# Patient Record
Sex: Male | Born: 1945 | Race: Black or African American | Hispanic: No | Marital: Single | Smoking: Current some day smoker
Health system: Southern US, Community
[De-identification: ages and names within clinical notes are randomized; demographics above are authoritative.]

---

## 2008-11-02 ENCOUNTER — Emergency Department (HOSPITAL_COMMUNITY): Admission: EM | Admit: 2008-11-02 | Discharge: 2008-11-02 | Payer: Self-pay | Admitting: Emergency Medicine
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
## 2011-02-16 LAB — POCT I-STAT, CHEM 8
Calcium, Ion: 1.08 mmol/L — ABNORMAL LOW (ref 1.12–1.32)
Glucose, Bld: 109 mg/dL — ABNORMAL HIGH (ref 70–99)
HCT: 43 % (ref 39.0–52.0)
Hemoglobin: 14.6 g/dL (ref 13.0–17.0)
TCO2: 25 mmol/L (ref 0–100)

## 2011-02-16 LAB — DIFFERENTIAL
Basophils Relative: 1 % (ref 0–1)
Eosinophils Absolute: 0.1 10*3/uL (ref 0.0–0.7)
Eosinophils Relative: 3 % (ref 0–5)
Monocytes Absolute: 0.4 10*3/uL (ref 0.1–1.0)
Monocytes Relative: 8 % (ref 3–12)

## 2011-02-16 LAB — CBC
Hemoglobin: 12.9 g/dL — ABNORMAL LOW (ref 13.0–17.0)
MCHC: 31.6 g/dL (ref 30.0–36.0)
MCV: 86.3 fL (ref 78.0–100.0)
RBC: 4.74 MIL/uL (ref 4.22–5.81)
RDW: 14.5 % (ref 11.5–15.5)
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
## 2018-06-24 ENCOUNTER — Emergency Department (HOSPITAL_COMMUNITY): Payer: No Typology Code available for payment source | Admitting: Certified Registered Nurse Anesthetist

## 2018-06-24 ENCOUNTER — Ambulatory Visit (HOSPITAL_COMMUNITY)
Admission: EM | Admit: 2018-06-24 | Discharge: 2018-06-24 | Disposition: A | Discharge status: Home / Self Care | Payer: No Typology Code available for payment source | Attending: Emergency Medicine | Admitting: Emergency Medicine

## 2018-06-24 ENCOUNTER — Emergency Department (HOSPITAL_COMMUNITY): Payer: No Typology Code available for payment source

## 2018-06-24 ENCOUNTER — Other Ambulatory Visit: Payer: Self-pay

## 2018-06-24 ENCOUNTER — Ambulatory Visit (HOSPITAL_COMMUNITY)
Admission: EM | Admit: 2018-06-24 | Discharge: 2018-06-24 | Disposition: A | Discharge status: Home / Self Care | Payer: Self-pay

## 2018-06-24 ENCOUNTER — Encounter (HOSPITAL_COMMUNITY): Payer: Self-pay

## 2018-06-24 ENCOUNTER — Encounter (HOSPITAL_COMMUNITY)
Admission: EM | Disposition: A | Discharge status: Home / Self Care | Payer: Self-pay | Source: Home / Self Care | Attending: Emergency Medicine

## 2018-06-24 DIAGNOSIS — S68119A Complete traumatic metacarpophalangeal amputation of unspecified finger, initial encounter: Secondary | ICD-10-CM

## 2018-06-24 DIAGNOSIS — S68120A Partial traumatic metacarpophalangeal amputation of right index finger, initial encounter: Secondary | ICD-10-CM | POA: Insufficient documentation

## 2018-06-24 DIAGNOSIS — W3189XA Contact with other specified machinery, initial encounter: Secondary | ICD-10-CM | POA: Insufficient documentation

## 2018-06-24 DIAGNOSIS — F17210 Nicotine dependence, cigarettes, uncomplicated: Secondary | ICD-10-CM | POA: Diagnosis not present

## 2018-06-24 HISTORY — PX: AMPUTATION: SHX166

## 2018-06-24 LAB — BASIC METABOLIC PANEL
Anion gap: 11 (ref 5–15)
BUN: 19 mg/dL (ref 8–23)
CHLORIDE: 105 mmol/L (ref 98–111)
CO2: 24 mmol/L (ref 22–32)
CREATININE: 1.39 mg/dL — AB (ref 0.61–1.24)
Calcium: 9.4 mg/dL (ref 8.9–10.3)
GFR calc Af Amer: 57 mL/min — ABNORMAL LOW (ref >60)
GFR calc non Af Amer: 49 mL/min — ABNORMAL LOW (ref >60)
Glucose, Bld: 99 mg/dL (ref 70–99)
POTASSIUM: 3.8 mmol/L (ref 3.5–5.1)
SODIUM: 140 mmol/L (ref 135–145)

## 2018-06-24 LAB — CBC WITH DIFFERENTIAL/PLATELET
ABS IMMATURE GRANULOCYTES: 0 10*3/uL (ref 0.0–0.1)
BASOS ABS: 0 10*3/uL (ref 0.0–0.1)
Basophils Relative: 0 %
EOS PCT: 1 %
Eosinophils Absolute: 0.1 10*3/uL (ref 0.0–0.7)
HCT: 42 % (ref 39.0–52.0)
HEMOGLOBIN: 13.2 g/dL (ref 13.0–17.0)
Immature Granulocytes: 0 %
LYMPHS ABS: 1.7 10*3/uL (ref 0.7–4.0)
LYMPHS PCT: 35 %
MCH: 26.9 pg (ref 26.0–34.0)
MCHC: 31.4 g/dL (ref 30.0–36.0)
MCV: 85.5 fL (ref 78.0–100.0)
MONO ABS: 0.3 10*3/uL (ref 0.1–1.0)
MONOS PCT: 7 %
NEUTROS ABS: 2.7 10*3/uL (ref 1.7–7.7)
Neutrophils Relative %: 57 %
Platelets: 265 10*3/uL (ref 150–400)
RBC: 4.91 MIL/uL (ref 4.22–5.81)
RDW: 14.6 % (ref 11.5–15.5)
WBC: 4.8 10*3/uL (ref 4.0–10.5)

## 2018-06-24 SURGERY — AMPUTATION DIGIT
Anesthesia: Monitor Anesthesia Care | Site: Finger | Laterality: Right

## 2018-06-24 MED ORDER — PROPOFOL 500 MG/50ML IV EMUL
INTRAVENOUS | Status: DC | PRN
  Administered 2018-06-24: 25 ug/kg/min via INTRAVENOUS

## 2018-06-24 MED ORDER — EPHEDRINE 5 MG/ML INJ
INTRAVENOUS | Status: AC
  Filled 2018-06-24: qty 10

## 2018-06-24 MED ORDER — OXYCODONE HCL 5 MG PO TABS: 5.0000 mg | ORAL_TABLET | Freq: Four times a day (QID) | ORAL | 0 refills | Status: AC | PRN

## 2018-06-24 MED ORDER — PROPOFOL 10 MG/ML IV BOLUS
INTRAVENOUS | Status: AC
  Filled 2018-06-24: qty 20

## 2018-06-24 MED ORDER — LIDOCAINE HCL (PF) 1 % IJ SOLN: 5.0000 mL | Freq: Once | INTRAMUSCULAR | Status: DC

## 2018-06-24 MED ORDER — TETANUS-DIPHTH-ACELL PERTUSSIS 5-2.5-18.5 LF-MCG/0.5 IM SUSP
0.5000 mL | Freq: Once | INTRAMUSCULAR | Status: AC
  Administered 2018-06-24: 0.5 mL via INTRAMUSCULAR
  Filled 2018-06-24: qty 0.5

## 2018-06-24 MED ORDER — BUPIVACAINE HCL (PF) 0.25 % IJ SOLN
INTRAMUSCULAR | Status: AC
  Filled 2018-06-24: qty 30

## 2018-06-24 MED ORDER — HYDROMORPHONE HCL 1 MG/ML IJ SOLN
1.0000 mg | Freq: Once | INTRAMUSCULAR | Status: AC
  Administered 2018-06-24: 1 mg via INTRAVENOUS
  Filled 2018-06-24: qty 1

## 2018-06-24 MED ORDER — LIDOCAINE HCL (PF) 1 % IJ SOLN
INTRAMUSCULAR | Status: AC
  Filled 2018-06-24: qty 30

## 2018-06-24 MED ORDER — EPHEDRINE SULFATE-NACL 50-0.9 MG/10ML-% IV SOSY
PREFILLED_SYRINGE | INTRAVENOUS | Status: DC | PRN
  Administered 2018-06-24 (×4): 5 mg via INTRAVENOUS

## 2018-06-24 MED ORDER — FENTANYL CITRATE (PF) 100 MCG/2ML IJ SOLN
INTRAMUSCULAR | Status: DC | PRN
  Administered 2018-06-24: 50 ug via INTRAVENOUS

## 2018-06-24 MED ORDER — 0.9 % SODIUM CHLORIDE (POUR BTL) OPTIME
TOPICAL | Status: DC | PRN
  Administered 2018-06-24: 1000 mL

## 2018-06-24 MED ORDER — CEFAZOLIN SODIUM-DEXTROSE 2-4 GM/100ML-% IV SOLN: 2.0000 g | INTRAVENOUS | Status: DC

## 2018-06-24 MED ORDER — CEFAZOLIN SODIUM-DEXTROSE 2-4 GM/100ML-% IV SOLN
2.0000 g | Freq: Once | INTRAVENOUS | Status: AC
  Administered 2018-06-24: 2 g via INTRAVENOUS
  Filled 2018-06-24: qty 100

## 2018-06-24 MED ORDER — MIDAZOLAM HCL 5 MG/5ML IJ SOLN
INTRAMUSCULAR | Status: DC | PRN
  Administered 2018-06-24: 1 mg via INTRAVENOUS

## 2018-06-24 MED ORDER — CEPHALEXIN 500 MG PO CAPS: 500.0000 mg | ORAL_CAPSULE | Freq: Four times a day (QID) | ORAL | 0 refills | Status: AC

## 2018-06-24 MED ORDER — PHENYLEPHRINE 40 MCG/ML (10ML) SYRINGE FOR IV PUSH (FOR BLOOD PRESSURE SUPPORT)
PREFILLED_SYRINGE | INTRAVENOUS | Status: DC | PRN
  Administered 2018-06-24 (×3): 40 ug via INTRAVENOUS

## 2018-06-24 MED ORDER — LIDOCAINE HCL (PF) 1 % IJ SOLN
INTRAMUSCULAR | Status: DC | PRN
  Administered 2018-06-24: 5 mL

## 2018-06-24 MED ORDER — LACTATED RINGERS IV SOLN
INTRAVENOUS | Status: DC
  Administered 2018-06-24: 15:00:00 via INTRAVENOUS

## 2018-06-24 MED ORDER — FENTANYL CITRATE (PF) 250 MCG/5ML IJ SOLN
INTRAMUSCULAR | Status: AC
  Filled 2018-06-24: qty 5

## 2018-06-24 MED ORDER — POVIDONE-IODINE 10 % EX SWAB: 2.0000 "application " | Freq: Once | CUTANEOUS | Status: DC

## 2018-06-24 MED ORDER — MIDAZOLAM HCL 2 MG/2ML IJ SOLN
INTRAMUSCULAR | Status: AC
  Filled 2018-06-24: qty 2

## 2018-06-24 MED ORDER — IBUPROFEN 200 MG PO TABS: 600.0000 mg | ORAL_TABLET | Freq: Four times a day (QID) | ORAL | Status: AC

## 2018-06-24 MED ORDER — CHLORHEXIDINE GLUCONATE 4 % EX LIQD
60.0000 mL | Freq: Once | CUTANEOUS | Status: DC
  Filled 2018-06-24: qty 60

## 2018-06-24 MED ORDER — BUPIVACAINE HCL (PF) 0.25 % IJ SOLN
INTRAMUSCULAR | Status: DC | PRN
  Administered 2018-06-24: 5 mL

## 2018-06-24 MED ORDER — ACETAMINOPHEN 325 MG PO TABS: 650.0000 mg | ORAL_TABLET | Freq: Four times a day (QID) | ORAL | Status: AC

## 2018-06-24 MED ORDER — PROPOFOL 10 MG/ML IV BOLUS
INTRAVENOUS | Status: DC | PRN
  Administered 2018-06-24: 20 mg via INTRAVENOUS

## 2018-06-24 SURGICAL SUPPLY — 47 items
BANDAGE COBAN STERILE 2 (GAUZE/BANDAGES/DRESSINGS) ×4 IMPLANT
BLADE AVERAGE 25MMX9MM (BLADE)
BLADE AVERAGE 25X9 (BLADE) IMPLANT
BLADE SURG 15 STRL LF DISP TIS (BLADE) ×2 IMPLANT
BLADE SURG 15 STRL SS (BLADE) ×2
BNDG COHESIVE 1X5 TAN STRL LF (GAUZE/BANDAGES/DRESSINGS) ×4 IMPLANT
BNDG COHESIVE 4X5 TAN NS LF (GAUZE/BANDAGES/DRESSINGS) IMPLANT
BNDG COHESIVE 4X5 TAN STRL (GAUZE/BANDAGES/DRESSINGS) ×4 IMPLANT
BNDG CONFORM 2 STRL LF (GAUZE/BANDAGES/DRESSINGS) ×4 IMPLANT
BNDG CONFORM 3 STRL LF (GAUZE/BANDAGES/DRESSINGS) ×4 IMPLANT
BNDG ELASTIC 2X5.8 VLCR STR LF (GAUZE/BANDAGES/DRESSINGS) IMPLANT
BNDG ESMARK 4X9 LF (GAUZE/BANDAGES/DRESSINGS) ×4 IMPLANT
BNDG GAUZE ELAST 4 BULKY (GAUZE/BANDAGES/DRESSINGS) ×8 IMPLANT
BRUSH SCRUB EZ PLAIN DRY (MISCELLANEOUS) IMPLANT
CANISTER SUCT 3000ML PPV (MISCELLANEOUS) ×4 IMPLANT
CHLORAPREP W/TINT 26ML (MISCELLANEOUS) ×4 IMPLANT
CORDS BIPOLAR (ELECTRODE) ×4 IMPLANT
CUFF TOURNIQUET SINGLE 24IN (TOURNIQUET CUFF) ×4 IMPLANT
DRAPE SURG 17X23 STRL (DRAPES) ×4 IMPLANT
DRSG ADAPTIC 3X8 NADH LF (GAUZE/BANDAGES/DRESSINGS) ×4 IMPLANT
DRSG EMULSION OIL 3X3 NADH (GAUZE/BANDAGES/DRESSINGS) ×4 IMPLANT
ELECT REM PT RETURN 9FT ADLT (ELECTROSURGICAL)
ELECTRODE REM PT RTRN 9FT ADLT (ELECTROSURGICAL) IMPLANT
GAUZE SPONGE 4X4 12PLY STRL (GAUZE/BANDAGES/DRESSINGS) ×4 IMPLANT
GAUZE XEROFORM 1X8 LF (GAUZE/BANDAGES/DRESSINGS) ×4 IMPLANT
GLOVE BIO SURGEON STRL SZ7.5 (GLOVE) ×4 IMPLANT
GLOVE BIOGEL PI IND STRL 8 (GLOVE) ×2 IMPLANT
GLOVE BIOGEL PI INDICATOR 8 (GLOVE) ×2
GOWN STRL REUS W/ TWL XL LVL3 (GOWN DISPOSABLE) ×2 IMPLANT
GOWN STRL REUS W/TWL XL LVL3 (GOWN DISPOSABLE) ×2
KIT BASIN OR (CUSTOM PROCEDURE TRAY) ×4 IMPLANT
NEEDLE HYPO 25X1 1.5 SAFETY (NEEDLE) IMPLANT
NS IRRIG 1000ML POUR BTL (IV SOLUTION) ×4 IMPLANT
PACK ORTHO EXTREMITY (CUSTOM PROCEDURE TRAY) ×4 IMPLANT
PAD CAST 4YDX4 CTTN HI CHSV (CAST SUPPLIES) ×2 IMPLANT
PADDING CAST ABS 4INX4YD NS (CAST SUPPLIES) ×2
PADDING CAST ABS COTTON 4X4 ST (CAST SUPPLIES) ×2 IMPLANT
PADDING CAST COTTON 4X4 STRL (CAST SUPPLIES) ×2
PENCIL BUTTON HOLSTER BLD 10FT (ELECTRODE) IMPLANT
SUT ETHILON 4 0 PS 2 18 (SUTURE) ×4 IMPLANT
SUT MNCRL AB 4-0 PS2 18 (SUTURE) ×4 IMPLANT
SUT VIC AB 2-0 CT3 27 (SUTURE) ×4 IMPLANT
SUT VICRYL 4-0 PS2 18IN ABS (SUTURE) ×4 IMPLANT
SUT VICRYL RAPIDE 4/0 PS 2 (SUTURE) ×4 IMPLANT
SYR 10ML LL (SYRINGE) ×4 IMPLANT
TOWEL OR 17X24 6PK STRL BLUE (TOWEL DISPOSABLE) ×4 IMPLANT
UNDERPAD 30X30 (UNDERPADS AND DIAPERS) ×4 IMPLANT

## 2018-06-24 NOTE — Anesthesia Preprocedure Evaluation (Signed)
Anesthesia Evaluation  Patient identified by MRN, date of birth, ID band Patient awake    Reviewed: Allergy & Precautions, H&P , NPO status , Patient's Chart, lab work & pertinent test results  Airway Mallampati: II   Neck ROM: full    Dental   Pulmonary Current Smoker,    breath sounds clear to auscultation       Cardiovascular negative cardio ROS   Rhythm:regular Rate:Normal     Neuro/Psych    GI/Hepatic   Endo/Other    Renal/GU      Musculoskeletal   Abdominal   Peds  Hematology   Anesthesia Other Findings   Reproductive/Obstetrics                             Anesthesia Physical Anesthesia Plan  ASA: II  Anesthesia Plan: MAC   Post-op Pain Management:    Induction: Intravenous  PONV Risk Score and Plan: 0 and Propofol infusion, Ondansetron and Treatment may vary due to age or medical condition  Airway Management Planned: Simple Face Mask  Additional Equipment:   Intra-op Plan:   Post-operative Plan:   Informed Consent: I have reviewed the patients History and Physical, chart, labs and discussed the procedure including the risks, benefits and alternatives for the proposed anesthesia with the patient or authorized representative who has indicated his/her understanding and acceptance.     Plan Discussed with: CRNA, Anesthesiologist and Surgeon  Anesthesia Plan Comments:         Anesthesia Quick Evaluation

## 2018-06-24 NOTE — Transfer of Care (Signed)
Immediate Anesthesia Transfer of Care Note  Patient: Raymond Vaughn  Procedure(s) Performed: AMPUTATION DIGIT (Right Finger)  Patient Location: PACU  Anesthesia Type:MAC combined with regional for post-op pain  Level of Consciousness: awake, alert , oriented and patient cooperative  Airway & Oxygen Therapy: Patient Spontanous Breathing and Patient connected to nasal cannula oxygen  Post-op Assessment: Report given to RN, Post -op Vital signs reviewed and stable and Patient moving all extremities X 4  Post vital signs: Reviewed and stable  Last Vitals:  Vitals Value Taken Time  BP 130/87 06/24/2018  4:24 PM  Temp    Pulse 57 06/24/2018  4:25 PM  Resp 10 06/24/2018  4:25 PM  SpO2 100 % 06/24/2018  4:25 PM  Vitals shown include unvalidated device data.  Last Pain:  Vitals:   06/24/18 1435  TempSrc:   PainSc: 8          Complications: No apparent anesthesia complications

## 2018-06-24 NOTE — Discharge Instructions (Addendum)
Discharge Instructions ° ° ° °Move your fingers as much as possible, making a full fist and fully opening the fist. °Elevate your hand to reduce pain & swelling of the digits.  Ice over the operative site may be helpful to reduce pain & swelling.  DO NOT USE HEAT. °Leave the dressing in place until you return to our office.  °You may shower, but keep the bandage clean & dry.  °You may drive a car when you are off of prescription pain medications and can safely control your vehicle with both hands. °Our office will call you to arrange follow-up ° ° °Please call [phone] during normal business hours or [phone] after hours for any problems. Including the following: ° °- excessive redness of the incisions °- drainage for more than 4 days °- fever of more than 101.5 F ° °*Please note that pain medications will not be refilled after hours or on weekends. ° °

## 2018-06-24 NOTE — ED Notes (Signed)
Pt states his R pointer finger was caught in a log splitter. Dr. Delton SeeNelson evaluated pt and sent him to ER.

## 2018-06-24 NOTE — Op Note (Signed)
06/24/2018  2:55 PM  PATIENT:  Raymond Vaughn  73 y.o. male  PRE-OPERATIVE DIAGNOSIS:  R IF incomplete amputation through P2  POST-OPERATIVE DIAGNOSIS:  Same  PROCEDURE:  R IF traumatic incomplete amputation completion/revision using local tissue as transposition flap  SURGEON: Rayvon Char. Grandville Silos, MD  PHYSICIAN ASSISTANT: none  ANESTHESIA:  local and MAC  SPECIMENS:  None  DRAINS:   None  EBL:  less than 50 mL  PREOPERATIVE INDICATIONS:  [name] is a  72 y.o. male with R IF near-complete amputation through P2.  The risks benefits and alternatives were discussed with the patient preoperatively including but not limited to the risks of infection, bleeding, nerve injury, cardiopulmonary complications, the need for revision surgery, among others, and the patient verbalized understanding and consented to proceed.  OPERATIVE IMPLANTS: none  OPERATIVE PROCEDURE:  After receiving antibiotics in the ED, the patient was escorted to the operative theatre and placed in a supine position.  I performed a digital block with a mixture of local anesthetic containing lidocaine and Marcaine without epinephrine.  A surgical "time-out" was performed during which the planned procedure, proposed operative site, and the correct patient identity were compared to the operative consent and agreement confirmed by the circulating nurse according to current facility policy.  Following application of a tourniquet to the operative extremity, the exposed skin was pre-scrubbed with Betadine scrub before being formally prepped with Betadine and draped in the usual sterile fashion.  The limb was exsanguinated with an Esmarch bandage and the tourniquet inflated to approximately 125mHg higher than systolic BP.  The amputation site was further investigated.  There was circumferential disruption of tissues in excess of 270 degrees, leaving a dorsal radial bridge.  This bridge consisted mostly of the areolar  tissue in the digital nerve, as the digital artery was not intact.  A Foley flap was created from the distal part, using the areolar and skin bridge.  The FDP was pulled distally and transected, allowing it to retract.  The digital nerve was also divided and allowed to retract.  A minimal amount of bone was resected with a rongeur and smoothed with a rasp.  The wound was copiously irrigated and the tourniquet released.  There was sufficient bleeding from the proximal portions of the flap that I decided to try to incorporated into the closure and preserve skeletal length.  A portion of the flap was then inset for coverage, augmenting the proximal stump tissue.  This was inset with 4-0 Vicryl Rapide interrupted sutures and the remainder discarded.  A digital dressing was applied, not tightly, and he was taken to the recovery room in stable condition.   DISPOSITION:  He will be discharged home, with typical postop instructions, oral antibiotics and an analgesic plan, and our office will contact him to arrange the next appointment.

## 2018-06-24 NOTE — ED Notes (Signed)
OR reports patient to come to [facility]. Raymond Mornhompson MD at bedside speaking with patient. Belongings sent home with wife.
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
## 2018-06-24 NOTE — ED Provider Notes (Signed)
[facility] EMERGENCY DEPARTMENT Provider Note   CSN: [id number] Arrival date & time: 06/24/18  1307     History   Chief Complaint Chief Complaint  Patient presents with  . partial amputation/finger    HPI Raymond Vaughn is a 72 y.o. male otherwise healthy here presenting with right second finger amputation.  Patient states that he accidentally put his finger into a log splitter and had a crack.  He noticed that his fingers partially amputated.  He states that the finger is very throbbing and has severe pain.  He states that he is able to move it slightly but has numbness in the tip of the finger.  He does not remember his last tetanus shot.  He is not on blood thinners and has no allergies.  The history is provided by the patient.    History reviewed. No pertinent past medical history.  There are no active problems to display for this patient.   History reviewed. No pertinent surgical history.      Home Medications    Prior to Admission medications   Medication Sig Start Date End Date Taking? Authorizing Provider  aspirin EC 81 MG tablet Take 81 mg by mouth daily.   Yes [provider]  Cholecalciferol (VITAMIN D3 PO) Take 1 tablet by mouth daily.   Yes [provider]  PRESCRIPTION MEDICATION Take 1 tablet by mouth daily as needed (pain).   Yes [provider]  acetaminophen (TYLENOL) 325 MG tablet Take 2 tablets (650 mg total) by mouth every 6 (six) hours. 06/24/18   Mack Hook, MD  cephALEXin (KEFLEX) 500 MG capsule Take 1 capsule (500 mg total) by mouth 4 (four) times daily for 7 days. 06/24/18 07/01/18  Mack Hook, MD  ibuprofen (ADVIL) 200 MG tablet Take 3 tablets (600 mg total) by mouth every 6 (six) hours. 06/24/18   Mack Hook, MD  oxyCODONE (ROXICODONE) 5 MG immediate release tablet Take 1 tablet (5 mg total) by mouth every 6 (six) hours as needed for severe pain. 06/24/18   Mack Hook, MD     Family History History reviewed. No pertinent family history.  Social History Social History   Tobacco Use  . Smoking status: Current Some Day Smoker  . Smokeless tobacco: Never Used  Substance Use Topics  . Alcohol use: Not Currently  . Drug use: Never     Allergies   Patient has no known allergies.   Review of Systems Review of Systems  Skin: Positive for wound.  All other systems reviewed and are negative.    Physical Exam Updated Vital Signs BP 140/79 (BP Location: Left Arm)   Pulse (!) 57   Temp 98.2 F (36.8 C) (Oral)   Resp 16   Ht 6\' 2"  (1.88 m)   Wt 82.6 kg   SpO2 98%   BMI 23.37 kg/m   Physical Exam  Constitutional:  Uncomfortable   HENT:  Head: Normocephalic.  Eyes: Pupils are equal, round, and reactive to light.  Neck: Normal range of motion.  Cardiovascular: Normal rate.  Pulmonary/Chest: Effort normal.  Abdominal: Soft.  Musculoskeletal:  R index finger to cirumferential laceration at the DIP joint. There is bone exposed. Able to flex the finger slightly but has difficulty extending it. There is poor capillary refill   Neurological: He is alert.  Skin: Skin is warm.  Psychiatric: He has a normal mood and affect.  Nursing note and vitals reviewed.        ED  Treatments / Results  Labs (all labs ordered are listed, but only abnormal results are displayed) Labs Reviewed  BASIC METABOLIC PANEL - Abnormal; Notable for the following components:      Result Value   Creatinine, Ser 1.39 (*)    GFR calc non Af Amer 49 (*)    GFR calc Af Amer 57 (*)    All other components within normal limits  CBC WITH DIFFERENTIAL/PLATELET    EKG None  Radiology Dg Finger Index Right  Result Date: 06/24/2018 CLINICAL DATA:  Deep laceration of the index finger with log splitter. Incomplete amputation. EXAM: RIGHT INDEX FINGER 2+V COMPARISON:  None. FINDINGS: There is a transverse fracture through the shaft of the middle phalanx with slight  angulation and displacement as well as a deep laceration. No visible foreign bodies in the soft tissues. IMPRESSION: Incomplete amputation of the right index finger at the level of the midportion of the middle phalanx as described. Electronically Signed   By: Francene BoyersJames  Maxwell M.D.   On: 06/24/2018 14:18    Procedures Procedures (including critical care time)  CRITICAL CARE Performed by: Richardean Canalavid H Yao   Total critical care time: 30 minutes  Critical care time was exclusive of separately billable procedures and treating other patients.  Critical care was necessary to treat or prevent imminent or life-threatening deterioration.  Critical care was time spent personally by me on the following activities: development of treatment plan with patient and/or surrogate as well as nursing, discussions with consultants, evaluation of patient's response to treatment, examination of patient, obtaining history from patient or surrogate, ordering and performing treatments and interventions, ordering and review of laboratory studies, ordering and review of radiographic studies, pulse oximetry and re-evaluation of patient's condition.   Medications Ordered in ED Medications  lidocaine (PF) (XYLOCAINE) 1 % injection 5 mL ( Infiltration MAR Hold 06/24/18 1508)  chlorhexidine (HIBICLENS) 4 % liquid 4 application (has no administration in time range)  povidone-iodine 10 % swab 2 application (has no administration in time range)  lactated ringers infusion ( Intravenous New Bag/Given 06/24/18 1518)  0.9 % irrigation (POUR BTL) (1,000 mLs Irrigation Given 06/24/18 1528)  bupivacaine (PF) (MARCAINE) 0.25 % injection (5 mLs Other Given 06/24/18 1530)  Tdap (BOOSTRIX) injection 0.5 mL (0.5 mLs Intramuscular Given 06/24/18 1343)  ceFAZolin (ANCEF) IVPB 2g/100 mL premix (0 g Intravenous Stopped 06/24/18 1417)  HYDROmorphone (DILAUDID) injection 1 mg (1 mg Intravenous Given 06/24/18 1343)     Initial Impression / Assessment  and Plan / ED Course  I have reviewed the triage vital signs and the nursing notes.  Pertinent labs & imaging results that were available during my care of the patient were reviewed by me and considered in my medical decision making (see chart for details).    Raymond Vaughn is a 72 y.o. male here with partial amputation of the R 2nd finger. Will order preop labs, get xrays. Will update tdap and give prophylactic abx. Will consult hand.   3 pm Xray showed partial amputation. Preop labs unremarkable. Dr. Janee Mornhompson from hand here to take patient to the OR.    Final Clinical Impressions(s) / ED Diagnoses   Final diagnoses:  Finger amputation, traumatic, initial encounter    ED Discharge Orders         Ordered    ibuprofen (ADVIL) 200 MG tablet  Every 6 hours     06/24/18 1457    acetaminophen (TYLENOL) 325 MG tablet  Every 6 hours  06/24/18 1457    oxyCODONE (ROXICODONE) 5 MG immediate release tablet  Every 6 hours PRN     06/24/18 1457    cephALEXin (KEFLEX) 500 MG capsule  4 times daily     06/24/18 1457           Charlynne Pander, MD 06/24/18 [phone]

## 2018-06-24 NOTE — Anesthesia Procedure Notes (Signed)
Procedure Name: MAC Date/Time: 06/24/2018 3:44 PM Performed by: Colin Benton, CRNA Pre-anesthesia Checklist: Patient identified, Emergency Drugs available, Patient being monitored and Suction available Patient Re-evaluated:Patient Re-evaluated prior to induction Oxygen Delivery Method: Nasal cannula Preoxygenation: Pre-oxygenation with 100% oxygen Induction Type: IV induction Placement Confirmation: positive ETCO2 Dental Injury: Teeth and Oropharynx as per pre-operative assessment

## 2018-06-24 NOTE — ED Triage Notes (Signed)
Patient here with near amputation of right index finger after cuting on log splitter, bleeding controlled. Saline applied

## 2018-06-25 NOTE — Anesthesia Postprocedure Evaluation (Signed)
Anesthesia Post Note  Patient: Raymond Vaughn  Procedure(s) Performed: AMPUTATION DIGIT (Right Finger)     Patient location during evaluation: PACU Anesthesia Type: MAC Level of consciousness: awake and alert Pain management: pain level controlled Vital Signs Assessment: post-procedure vital signs reviewed and stable Respiratory status: spontaneous breathing, nonlabored ventilation, respiratory function stable and patient connected to nasal cannula oxygen Cardiovascular status: stable and blood pressure returned to baseline Postop Assessment: no apparent nausea or vomiting Anesthetic complications: no    Last Vitals:  Vitals:   06/24/18 1637 06/24/18 1642  BP: (!) 142/84 (!) 143/89  Pulse: 60 60  Resp: 14 18  Temp:  (!) 36.3 C  SpO2: 99% 100%    Last Pain:  Vitals:   06/24/18 1435  TempSrc:   PainSc: Odin

## 2018-06-26 ENCOUNTER — Encounter (HOSPITAL_COMMUNITY): Payer: Self-pay | Admitting: Orthopedic Surgery
